# Patient Record
Sex: Female | Born: 1995 | Race: White | Hispanic: No | Marital: Single | Smoking: Never smoker
Health system: Southern US, Community
[De-identification: ages and names within clinical notes are randomized; demographics above are authoritative.]

---
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
## 2017-02-03 ENCOUNTER — Telehealth: Payer: Self-pay | Admitting: *Deleted

## 2017-02-03 NOTE — Telephone Encounter (Signed)
Pt mother Clydie Braun (has DPR access) called to receive pt recent lab results on 12/13/16, mother informed with normal CBC level.

## 2017-03-12 ENCOUNTER — Encounter: Payer: Self-pay | Admitting: Gynecology

## 2017-09-21 ENCOUNTER — Other Ambulatory Visit: Payer: Self-pay

## 2017-09-21 ENCOUNTER — Encounter (HOSPITAL_COMMUNITY): Payer: Self-pay | Admitting: Emergency Medicine

## 2017-09-21 ENCOUNTER — Telehealth (HOSPITAL_COMMUNITY): Payer: Self-pay | Admitting: Emergency Medicine

## 2017-09-21 ENCOUNTER — Ambulatory Visit (HOSPITAL_COMMUNITY)
Admission: EM | Admit: 2017-09-21 | Discharge: 2017-09-21 | Disposition: A | Discharge status: Home / Self Care | Payer: BLUE CROSS/BLUE SHIELD | Attending: Family Medicine | Admitting: Family Medicine

## 2017-09-21 DIAGNOSIS — R3989 Other symptoms and signs involving the genitourinary system: Secondary | ICD-10-CM | POA: Insufficient documentation

## 2017-09-21 DIAGNOSIS — N3090 Cystitis, unspecified without hematuria: Secondary | ICD-10-CM | POA: Diagnosis not present

## 2017-09-21 DIAGNOSIS — R300 Dysuria: Secondary | ICD-10-CM | POA: Diagnosis not present

## 2017-09-21 LAB — POCT URINALYSIS DIP (DEVICE)
BILIRUBIN URINE: NEGATIVE
Glucose, UA: NEGATIVE mg/dL
Ketones, ur: NEGATIVE mg/dL
NITRITE: NEGATIVE
Protein, ur: 30 mg/dL — AB
Specific Gravity, Urine: <=1.005 (ref 1.005–1.030)
Urobilinogen, UA: 0.2 mg/dL (ref 0.0–1.0)
pH: 6.5 (ref 5.0–8.0)

## 2017-09-21 LAB — POCT PREGNANCY, URINE: PREG TEST UR: NEGATIVE

## 2017-09-21 MED ORDER — SULFAMETHOXAZOLE-TRIMETHOPRIM 800-160 MG PO TABS: 1.0000 | ORAL_TABLET | Freq: Two times a day (BID) | ORAL | 0 refills | Status: DC

## 2017-09-21 MED ORDER — SULFAMETHOXAZOLE-TRIMETHOPRIM 800-160 MG PO TABS: 1.0000 | ORAL_TABLET | Freq: Two times a day (BID) | ORAL | 0 refills | Status: AC

## 2017-09-21 NOTE — Telephone Encounter (Signed)
Pt called to have rx sent to different pharmacy

## 2017-09-21 NOTE — Discharge Instructions (Signed)
A urine culture is being performed to make sure the bacteria causing the infection is going to completely eradicate the infection.  The results of the culture will be completed by Wednesday. Continue the antibiotics unless told to switch to something else.

## 2017-09-21 NOTE — ED Triage Notes (Signed)
Pt c/o UTi symptoms, dysuria, bladder pain.

## 2017-09-21 NOTE — ED Provider Notes (Signed)
[facility]   [id number] 09/21/17 Arrival Time: 1625   SUBJECTIVE:  Kimberly Sexton is a 21 y.o. female who presents to the urgent care with complaint of UTi symptoms, dysuria, bladder pain.      History reviewed. No pertinent past medical history. Family History  Problem Relation Age of Onset  . Breast cancer Maternal Aunt   . Cancer Maternal Grandmother        thyroid   . Hypertension Maternal Grandmother   . Cancer Other        ovarian- maternal great-great grandmother    Social History   Socioeconomic History  . Marital status: Single    Spouse name: Not on file  . Number of children: Not on file  . Years of education: Not on file  . Highest education level: Not on file  Social Needs  . Financial resource strain: Not on file  . Food insecurity - worry: Not on file  . Food insecurity - inability: Not on file  . Transportation needs - medical: Not on file  . Transportation needs - non-medical: Not on file  Occupational History  . Not on file  Tobacco Use  . Smoking status: Never Smoker  . Smokeless tobacco: Never Used  Substance and Sexual Activity  . Alcohol use: No  . Drug use: Not on file  . Sexual activity: No  Other Topics Concern  . Not on file  Social History Narrative  . Not on file   Current Meds  Medication Sig  . desogestrel-ethinyl estradiol (MIRCETTE) 0.15-0.02/0.01 MG (21/5) tablet Take 1 tablet by mouth daily.   No Known Allergies    ROS: As per HPI, remainder of ROS negative.   OBJECTIVE:   Vitals:   09/21/17 1703  BP: 121/85  Pulse: 96  Resp: 14  Temp: 98.6 F (37 C)  SpO2: 100%     General appearance: alert; no distress Eyes: PERRL; EOMI; conjunctiva normal HENT: normocephalic; atraumatic, external ears normal without trauma; nasal mucosa normal; oral mucosa normal Neck: supple Back: no CVA tenderness Extremities: no cyanosis or edema; symmetrical with no gross deformities Skin: warm and dry Neurologic:  normal gait; grossly normal Psychological: alert and cooperative; normal mood and affect      Labs:  Results for orders placed or performed during the hospital encounter of 09/21/17  Pregnancy, urine POC  Result Value Ref Range   Preg Test, Ur NEGATIVE NEGATIVE  POCT urinalysis dip (device)  Result Value Ref Range   Glucose, UA NEGATIVE NEGATIVE mg/dL   Bilirubin Urine NEGATIVE NEGATIVE   Ketones, ur NEGATIVE NEGATIVE mg/dL   Specific Gravity, Urine <=1.005 1.005 - 1.030   Hgb urine dipstick LARGE (A) NEGATIVE   pH 6.5 5.0 - 8.0   Protein, ur 30 (A) NEGATIVE mg/dL   Urobilinogen, UA 0.2 0.0 - 1.0 mg/dL   Nitrite NEGATIVE NEGATIVE   Leukocytes, UA SMALL (A) NEGATIVE    Labs Reviewed  POCT URINALYSIS DIP (DEVICE) - Abnormal; Notable for the following components:      Result Value   Hgb urine dipstick LARGE (*)    Protein, ur 30 (*)    Leukocytes, UA SMALL (*)    All other components within normal limits  URINE CULTURE  POCT PREGNANCY, URINE    No results found.     ASSESSMENT & PLAN:  1. Cystitis     Meds ordered this encounter  Medications  . sulfamethoxazole-trimethoprim (BACTRIM DS,SEPTRA DS) 800-160 MG tablet    Sig:  Take 1 tablet by mouth 2 (two) times daily for 7 days.    Dispense:  14 tablet    Refill:  0    Reviewed expectations re: course of current medical issues. Questions answered. Outlined signs and symptoms indicating need for more acute intervention. Patient verbalized understanding. After Visit Summary given.    Procedures:      Elvina SidleLauenstein, Dreden Rivere, MD 09/21/17 1719

## 2017-09-22 LAB — URINE CULTURE: Culture: NO GROWTH

## 2017-10-29 ENCOUNTER — Telehealth: Payer: Self-pay | Admitting: *Deleted

## 2017-10-29 DIAGNOSIS — Z3009 Encounter for other general counseling and advice on contraception: Secondary | ICD-10-CM

## 2017-10-29 MED ORDER — DESOGESTREL-ETHINYL ESTRADIOL 0.15-0.02/0.01 MG (21/5) PO TABS: 1.0000 | ORAL_TABLET | Freq: Every day | ORAL | 0 refills | Status: DC

## 2017-10-29 NOTE — Telephone Encounter (Signed)
Patient has annual scheduled on 12/16/17 needs refill on birth control pills. Rx sent.

## 2017-12-16 ENCOUNTER — Encounter: Payer: Self-pay | Admitting: Women's Health

## 2017-12-16 ENCOUNTER — Ambulatory Visit (INDEPENDENT_AMBULATORY_CARE_PROVIDER_SITE_OTHER): Payer: BLUE CROSS/BLUE SHIELD | Admitting: Women's Health

## 2017-12-16 VITALS — BP 102/65 | Ht 69.0 in | Wt 125.2 lb

## 2017-12-16 DIAGNOSIS — Z113 Encounter for screening for infections with a predominantly sexual mode of transmission: Secondary | ICD-10-CM | POA: Diagnosis not present

## 2017-12-16 DIAGNOSIS — Z01419 Encounter for gynecological examination (general) (routine) without abnormal findings: Secondary | ICD-10-CM | POA: Diagnosis not present

## 2017-12-16 MED ORDER — DROSPIRENONE-ETHINYL ESTRADIOL 3-0.02 MG PO TABS: 1.0000 | ORAL_TABLET | Freq: Every day | ORAL | 4 refills | Status: DC

## 2017-12-16 NOTE — Patient Instructions (Signed)

## 2017-12-16 NOTE — Progress Notes (Signed)
Kimberly DickerCaroline H Sexton 12/04/1995 [id number]    History:    Presents for annual exam.  Monthly cycle on Mircette but has had continued problems with acne. Would like to try different pill. Gardasil series completed. Sexually active first partner not his but had a negative GC/Chlamydia at student health. Reports several UTIs this past year asymptomatic today. Gardasil completed.  Past medical history, past surgical history, family history and social history were all reviewed and documented in the EPIC chart. Sophomore at [employer] doing well planning to study abroad in [place] fall  2019. Communication and design major. Parents healthy.  ROS:  A ROS was performed and pertinent positives and negatives are included.  Exam:  Vitals:   12/16/17 1550  BP: 102/65  Weight: 125 lb 3.2 oz (56.8 kg)  Height: 5\' 9"  (1.753 m)   Body mass index is 18.49 kg/m.   General appearance:  Normal Thyroid:  Symmetrical, normal in size, without palpable masses or nodularity. Respiratory  Auscultation:  Clear without wheezing or rhonchi Cardiovascular  Auscultation:  Regular rate, without rubs, murmurs or gallops  Edema/varicosities:  Not grossly evident Abdominal  Soft,nontender, without masses, guarding or rebound.  Liver/spleen:  No organomegaly noted  Hernia:  None appreciated  Skin  Inspection:  Grossly normal   Breasts: Examined lying and sitting.     Right: Without masses, retractions, discharge or axillary adenopathy.     Left: Without masses, retractions, discharge or axillary adenopathy. Gentitourinary   Inguinal/mons:  Normal without inguinal adenopathy  External genitalia:  Normal  BUS/Urethra/Skene's glands:  Normal  Vagina:  Normal  Cervix:  Normal  Uterus:  normal in size, shape and contour.  Midline and mobile  Adnexa/parametria:     Rt: Without masses or tenderness.   Lt: Without masses or tenderness.  Anus and perineum: Normal    Assessment/Plan:  22 y.o. S WF G0 for annual exam.    Monthly cycle on Mircette Acne STD screen  Plan: Options reviewed will try Yaz prescription, proper use, slight risk for blood clots and strokes reviewed. Finish out current pack prior to starting Yaz. Condoms encouraged until permanent partner. UTI prevention discussed. SBE's, exercise, calcium rich diet, MVI daily encouraged. Campus and driving safety reviewed. CBC, HIV, hep B, C, RPR.    Harrington Challengerancy J Oris [facility]NP, 4:43 PM 12/16/2017

## 2017-12-21 LAB — CBC WITH DIFFERENTIAL/PLATELET
BASOS PCT: 0.4 %
Basophils Absolute: 27 cells/uL (ref 0–200)
EOS ABS: 40 {cells}/uL (ref 15–500)
Eosinophils Relative: 0.6 %
HCT: 41.8 % (ref 35.0–45.0)
HEMOGLOBIN: 13.3 g/dL (ref 11.7–15.5)
Lymphs Abs: 2010 cells/uL (ref 850–3900)
MCH: 27.5 pg (ref 27.0–33.0)
MCHC: 31.8 g/dL — ABNORMAL LOW (ref 32.0–36.0)
MCV: 86.5 fL (ref 80.0–100.0)
MONOS PCT: 5.1 %
MPV: 9.6 fL (ref 7.5–12.5)
NEUTROS ABS: 4281 {cells}/uL (ref 1500–7800)
Neutrophils Relative %: 63.9 %
Platelets: 333 10*3/uL (ref 140–400)
RBC: 4.83 10*6/uL (ref 3.80–5.10)
RDW: 12.8 % (ref 11.0–15.0)
Total Lymphocyte: 30 %
WBC mixed population: 342 cells/uL (ref 200–950)
WBC: 6.7 10*3/uL (ref 3.8–10.8)

## 2017-12-21 LAB — HEPATITIS C ANTIBODY
HEP C AB: REACTIVE — AB
SIGNAL TO CUT-OFF: 4.31 — AB (ref <1.00)

## 2017-12-21 LAB — HCV RNA,QUANTITATIVE REAL TIME PCR
HCV Quantitative Log: <1.18 Log IU/mL
HCV RNA, PCR, QN: NOT DETECTED [IU]/mL

## 2017-12-21 LAB — HIV ANTIBODY (ROUTINE TESTING W REFLEX): HIV: NONREACTIVE

## 2017-12-21 LAB — HEPATITIS B SURFACE ANTIGEN: HEP B S AG: NONREACTIVE

## 2017-12-21 LAB — RPR: RPR Ser Ql: NONREACTIVE

## 2018-02-19 ENCOUNTER — Telehealth: Payer: Self-pay | Admitting: *Deleted

## 2018-02-19 NOTE — Telephone Encounter (Signed)
Patient called has some brown discharge for 1 day, has recurrent UTI infections, [facility] [employer] [facility] [employer] [facility], on second pack of new pack of birth control pills, I told pt not abnormal may have spotting with new pills.

## 2018-04-21 ENCOUNTER — Encounter: Payer: Self-pay | Admitting: Women's Health

## 2018-04-21 ENCOUNTER — Ambulatory Visit: Payer: BLUE CROSS/BLUE SHIELD | Admitting: Women's Health

## 2018-04-21 VITALS — BP 118/78

## 2018-04-21 DIAGNOSIS — N898 Other specified noninflammatory disorders of vagina: Secondary | ICD-10-CM

## 2018-04-21 MED ORDER — SULFAMETHOXAZOLE-TRIMETHOPRIM 800-160 MG PO TABS: 1.0000 | ORAL_TABLET | Freq: Two times a day (BID) | ORAL | 0 refills | Status: AC

## 2018-04-21 MED ORDER — DROSPIRENONE-ETHINYL ESTRADIOL 3-0.02 MG PO TABS: 1.0000 | ORAL_TABLET | Freq: Every day | ORAL | 4 refills | Status: DC

## 2018-04-21 NOTE — Progress Notes (Signed)
22 year old S WF G0 Presents with complaint of vaginal lesion which has been present since 10/2017. Denies burning, itching, pain or drainage at site. Denies vaginal discharge, vaginal irritation, burning with urination, urinary frequency, or urinary discomfort. Same partner. Yaz for contraception. History of recurrent UTIs. Planning abroad studies to [place] in fall 2019.  Exam:. Appears well.  External genitalia within normal limits with no visible lesion, bump, erythema or discharge.  Speculum exam no visible discharge, erythema or odor noted,  Normal external genitalia.  Plan: Reassurance given with normal exam findings. Prescription given for Yaz 3-0.02 mg x4 packs for travel abroad. Discussed proper use and increased risk of stroke and blood clots. Bactrim DS 800-160 mg twice daily prescribed. Instructed to carry with her to [place] but not to take it unless she feels she has a UTI. Proper use given.

## 2018-04-21 NOTE — Patient Instructions (Signed)
Interstitial Cystitis Interstitial cystitis is a condition that causes inflammation of the bladder. The bladder is a hollow organ in the lower part of your abdomen. It stores urine after the urine is made by your kidneys. With interstitial cystitis, you may have pain in the bladder area. You may also have a frequent and urgent need to urinate. The severity of interstitial cystitis can vary from person to person. You may have flare-ups of the condition, and then it may go away for a while. For many people who have this condition, it becomes a long-term problem. What are the causes? The cause of this condition is not known. What increases the risk? This condition is more likely to develop in women. What are the signs or symptoms? Symptoms of interstitial cystitis vary, and they can change over time. Symptoms may include:  Discomfort or pain in the bladder area. This can range from mild to severe. The pain may change in intensity as the bladder fills with urine or as it empties.  Pelvic pain.  An urgent need to urinate.  Frequent urination.  Pain during sexual intercourse.  Pinpoint bleeding on the bladder wall.  For women, the symptoms often get worse during menstruation. How is this diagnosed? This condition is diagnosed by evaluating your symptoms and ruling out other causes. A physical exam will be done. Various tests may be done to rule out other conditions. Common tests include:  Urine tests.  Cystoscopy. In this test, a tool that is like a very thin telescope is used to look into your bladder.  Biopsy. This involves taking a sample of tissue from the bladder wall to be examined under a microscope.  How is this treated? There is no cure for interstitial cystitis, but treatment methods are available to control your symptoms. Work closely with your health care provider to find the treatments that will be most effective for you. Treatment options may include:  Medicines to relieve  pain and to help reduce the number of times that you feel the need to urinate.  Bladder training. This involves learning ways to control when you urinate, such as: ? Urinating at scheduled times. ? Training yourself to delay urination. ? Doing exercises (Kegel exercises) to strengthen the muscles that control urine flow.  Lifestyle changes, such as changing your diet or taking steps to control stress.  Use of a device that provides electrical stimulation in order to reduce pain.  A procedure that stretches your bladder by filling it with air or fluid.  Surgery. This is rare. It is only done for extreme cases if other treatments do not help.  Follow these instructions at home:  Take medicines only as directed by your health care provider.  Use bladder training techniques as directed. ? Keep a bladder diary to find out which foods, liquids, or activities make your symptoms worse. ? Use your bladder diary to schedule bathroom trips. If you are away from home, plan to be near a bathroom at each of your scheduled times. ? Make sure you urinate just before you leave the house and just before you go to bed.  Do Kegel exercises as directed by your health care provider.  Do not drink alcohol.  Do not use any tobacco products, including cigarettes, chewing tobacco, or electronic cigarettes. If you need help quitting, ask your health care provider.  Make dietary changes as directed by your health care provider. You may need to avoid spicy foods and foods that contain a high amount   of potassium.  Limit your drinking of beverages that stimulate urination. These include soda, coffee, and tea.  Keep all follow-up visits as directed by your health care provider. This is important. Contact a health care provider if:  Your symptoms do not get better after treatment.  Your pain and discomfort are getting worse.  You have more frequent urges to urinate.  You have a fever. Get help right away  if:  You are not able to control your bladder at all. This information is not intended to replace advice given to you by your health care provider. Make sure you discuss any questions you have with your health care provider. Document Released: 06/14/2004 Document Revised: 03/21/2016 Document Reviewed: 06/21/2014 Elsevier Interactive Patient Education  2018 Elsevier Inc.  

## 2018-10-26 ENCOUNTER — Other Ambulatory Visit: Payer: Self-pay | Admitting: Women's Health

## 2018-10-26 ENCOUNTER — Telehealth: Payer: Self-pay | Admitting: *Deleted

## 2018-10-26 NOTE — Telephone Encounter (Signed)
Patient called att[employer] they have had  a mumps outbreak and she needs the 3rd booster vaccine, asked if you could prescribed it for her so [facility] will give her the vaccine? I did tell her I didn't think we could prescribe but I will check.  Please advise

## 2018-10-26 NOTE — Telephone Encounter (Signed)
Kimberly Sexton I placed medication pending can you confirm this is correct?

## 2018-10-26 NOTE — Telephone Encounter (Signed)
Patient informed. 

## 2018-10-26 NOTE — Telephone Encounter (Signed)
Okay to call in the MMR to [facility], I do not think she can just get a mump's vaccine.

## 2018-10-26 NOTE — Telephone Encounter (Signed)
Okay, sent the order to [facility] let her know to go there to get it.  Thank you

## 2018-10-27 DIAGNOSIS — Z23 Encounter for immunization: Secondary | ICD-10-CM | POA: Diagnosis not present

## 2018-10-27 MED ORDER — MEASLES, MUMPS & RUBELLA VAC IJ SOLR: 0.5000 mL | Freq: Once | INTRAMUSCULAR | 0 refills | Status: AC

## 2019-05-30 ENCOUNTER — Other Ambulatory Visit: Payer: Self-pay | Admitting: Women's Health

## 2019-05-31 NOTE — Telephone Encounter (Signed)
Patient called requesting refill on birth control pills, overdue for annual exam. I asked her to call and schedule.

## 2019-05-31 NOTE — Telephone Encounter (Signed)
Annual exam scheduled on 06/30/19

## 2019-06-29 ENCOUNTER — Other Ambulatory Visit: Payer: Self-pay

## 2019-06-30 ENCOUNTER — Encounter: Payer: Self-pay | Admitting: Women's Health

## 2019-06-30 ENCOUNTER — Ambulatory Visit: Payer: BC Managed Care – PPO | Admitting: Women's Health

## 2019-06-30 VITALS — BP 110/72 | Ht 69.0 in | Wt 131.6 lb

## 2019-06-30 DIAGNOSIS — Z01419 Encounter for gynecological examination (general) (routine) without abnormal findings: Secondary | ICD-10-CM | POA: Diagnosis not present

## 2019-06-30 LAB — CBC WITH DIFFERENTIAL/PLATELET
Absolute Monocytes: 439 cells/uL (ref 200–950)
Basophils Absolute: 31 cells/uL (ref 0–200)
Basophils Relative: 0.4 %
Eosinophils Absolute: 39 cells/uL (ref 15–500)
Eosinophils Relative: 0.5 %
HCT: 40.4 % (ref 35.0–45.0)
Hemoglobin: 13 g/dL (ref 11.7–15.5)
Lymphs Abs: 1340 cells/uL (ref 850–3900)
MCH: 27.9 pg (ref 27.0–33.0)
MCHC: 32.2 g/dL (ref 32.0–36.0)
MCV: 86.7 fL (ref 80.0–100.0)
MPV: 9.5 fL (ref 7.5–12.5)
Monocytes Relative: 5.7 %
Neutro Abs: 5852 cells/uL (ref 1500–7800)
Neutrophils Relative %: 76 %
Platelets: 255 10*3/uL (ref 140–400)
RBC: 4.66 10*6/uL (ref 3.80–5.10)
RDW: 12.8 % (ref 11.0–15.0)
Total Lymphocyte: 17.4 %
WBC: 7.7 10*3/uL (ref 3.8–10.8)

## 2019-06-30 MED ORDER — DROSPIRENONE-ETHINYL ESTRADIOL 3-0.02 MG PO TABS: 1.0000 | ORAL_TABLET | Freq: Every day | ORAL | 4 refills | Status: DC

## 2019-06-30 NOTE — Addendum Note (Signed)
Addended by: Lorine Bears on: 06/30/2019 12:25 PM   Modules accepted: Orders

## 2019-06-30 NOTE — Patient Instructions (Addendum)
Good luck with your senior year!!  Health Maintenance, Female Adopting a healthy lifestyle and getting preventive care are important in promoting health and wellness. Ask your health care provider about:  The right schedule for you to have regular tests and exams.  Things you can do on your own to prevent diseases and keep yourself healthy. What should I know about diet, weight, and exercise? Eat a healthy diet   Eat a diet that includes plenty of vegetables, fruits, low-fat dairy products, and lean protein.  Do not eat a lot of foods that are high in solid fats, added sugars, or sodium. Maintain a healthy weight Body mass index (BMI) is used to identify weight problems. It estimates body fat based on height and weight. Your health care provider can help determine your BMI and help you achieve or maintain a healthy weight. Get regular exercise Get regular exercise. This is one of the most important things you can do for your health. Most adults should:  Exercise for at least 150 minutes each week. The exercise should increase your heart rate and make you sweat (moderate-intensity exercise).  Do strengthening exercises at least twice a week. This is in addition to the moderate-intensity exercise.  Spend less time sitting. Even light physical activity can be beneficial. Watch cholesterol and blood lipids Have your blood tested for lipids and cholesterol at 21 years of age, then have this test every 5 years. Have your cholesterol levels checked more often if:  Your lipid or cholesterol levels are high.  You are older than 21 years of age.  You are at high risk for heart disease. What should I know about cancer screening? Depending on your health history and family history, you may need to have cancer screening at various ages. This may include screening for:  Breast cancer.  Cervical cancer.  Colorectal cancer.  Skin cancer.  Lung cancer. What should I know about heart  disease, diabetes, and high blood pressure? Blood pressure and heart disease  High blood pressure causes heart disease and increases the risk of stroke. This is more likely to develop in people who have high blood pressure readings, are of African descent, or are overweight.  Have your blood pressure checked: ? Every 3-5 years if you are 24-82 years of age. ? Every year if you are 6 years old or older. Diabetes Have regular diabetes screenings. This checks your fasting blood sugar level. Have the screening done:  Once every three years after age 22 if you are at a normal weight and have a low risk for diabetes.  More often and at a younger age if you are overweight or have a high risk for diabetes. What should I know about preventing infection? Hepatitis B If you have a higher risk for hepatitis B, you should be screened for this virus. Talk with your health care provider to find out if you are at risk for hepatitis B infection. Hepatitis C Testing is recommended for:  Everyone born from [birth year] through 1963.  Anyone with known risk factors for hepatitis C. Sexually transmitted infections (STIs)  Get screened for STIs, including gonorrhea and chlamydia, if: ? You are sexually active and are younger than 21 years of age. ? You are older than 21 years of age and your health care provider tells you that you are at risk for this type of infection. ? Your sexual activity has changed since you were last screened, and you are at increased risk for chlamydia or  gonorrhea. Ask your health care provider if you are at risk.  Ask your health care provider about whether you are at high risk for HIV. Your health care provider may recommend a prescription medicine to help prevent HIV infection. If you choose to take medicine to prevent HIV, you should first get tested for HIV. You should then be tested every 3 months for as long as you are taking the medicine. Pregnancy  If you are about to stop  having your period (premenopausal) and you may become pregnant, seek counseling before you get pregnant.  Take 400 to 800 micrograms (mcg) of folic acid every day if you become pregnant.  Ask for birth control (contraception) if you want to prevent pregnancy. Osteoporosis and menopause Osteoporosis is a disease in which the bones lose minerals and strength with aging. This can result in bone fractures. If you are 80 years old or older, or if you are at risk for osteoporosis and fractures, ask your health care provider if you should:  Be screened for bone loss.  Take a calcium or vitamin D supplement to lower your risk of fractures.  Be given hormone replacement therapy (HRT) to treat symptoms of menopause. Follow these instructions at home: Lifestyle  Do not use any products that contain nicotine or tobacco, such as cigarettes, e-cigarettes, and chewing tobacco. If you need help quitting, ask your health care provider.  Do not use street drugs.  Do not share needles.  Ask your health care provider for help if you need support or information about quitting drugs. Alcohol use  Do not drink alcohol if: ? Your health care provider tells you not to drink. ? You are pregnant, may be pregnant, or are planning to become pregnant.  If you drink alcohol: ? Limit how much you use to 0-1 drink a day. ? Limit intake if you are breastfeeding.  Be aware of how much alcohol is in your drink. In the [place] one drink equals one 12 oz bottle of beer (355 mL), one 5 oz glass of wine (148 mL), or one 1 oz glass of hard liquor (44 mL). General instructions  Schedule regular health, dental, and eye exams.  Stay current with your vaccines.  Tell your health care provider if: ? You often feel depressed. ? You have ever been abused or do not feel safe at home. Summary  Adopting a healthy lifestyle and getting preventive care are important in promoting health and wellness.  Follow your health  care provider's instructions about healthy diet, exercising, and getting tested or screened for diseases.  Follow your health care provider's instructions on monitoring your cholesterol and blood pressure. This information is not intended to replace advice given to you by your health care provider. Make sure you discuss any questions you have with your health care provider. Document Released: 04/29/2011 Document Revised: 10/07/2018 Document Reviewed: 10/07/2018 Elsevier Patient Education  2020 [employer].

## 2019-06-30 NOTE — Progress Notes (Signed)
Kimberly Sexton [birth year] [id number]    History:    Presents for annual exam.  Monthly cycle on Yaz with good relief of dysmenorrhea .  Gardasil series completed.  Not sexually active, denies need for STD screen.  Past medical history, past surgical history, family history and social history were all reviewed and documented in the EPIC chart.  Senior at [employer], Airline pilot major.  Parents healthy.  ROS:  A ROS was performed and pertinent positives and negatives are included.  Exam:  Vitals:   06/30/19 1111  BP: 110/72  Weight: 131 lb 9.6 oz (59.7 kg)  Height: 5\' 9"  (1.753 m)   Body mass index is 19.43 kg/m.   General appearance:  Normal Thyroid:  Symmetrical, normal in size, without palpable masses or nodularity. Respiratory  Auscultation:  Clear without wheezing or rhonchi Cardiovascular  Auscultation:  Regular rate, without rubs, murmurs or gallops  Edema/varicosities:  Not grossly evident Abdominal  Soft,nontender, without masses, guarding or rebound.  Liver/spleen:  No organomegaly noted  Hernia:  None appreciated  Skin  Inspection:  Grossly normal   Breasts: Examined lying and sitting.     Right: Without masses, retractions, discharge or axillary adenopathy.     Left: Without masses, retractions, discharge or axillary adenopathy. Gentitourinary   Inguinal/mons:  Normal without inguinal adenopathy  External genitalia:  Normal  BUS/Urethra/Skene's glands:  Normal  Vagina:  Normal  Cervix:  Normal  Uterus:  normal in size, shape and contour.  Midline and mobile  Adnexa/parametria:     Rt: Without masses or tenderness.   Lt: Without masses or tenderness.  Anus and perineum: Normal   Assessment/Plan:  23 y.o. S WF G0 for annual exam with no complaints.  Monthly cycle on Yaz  Plan: Yaz prescription, proper use, slight risk for blood clots and strokes reviewed.  Condoms encouraged until permanent partner.  SBEs, exercise, calcium rich foods, MVI  daily encouraged.  CBC, Pap, new screening guidelines reviewed.[name] [facility], 11:19 AM 06/30/2019

## 2019-07-01 LAB — PAP IG W/ RFLX HPV ASCU

## 2019-07-07 DIAGNOSIS — Z20828 Contact with and (suspected) exposure to other viral communicable diseases: Secondary | ICD-10-CM | POA: Diagnosis not present

## 2019-09-20 DIAGNOSIS — Z20828 Contact with and (suspected) exposure to other viral communicable diseases: Secondary | ICD-10-CM | POA: Diagnosis not present

## 2020-07-13 ENCOUNTER — Telehealth: Payer: Self-pay | Admitting: *Deleted

## 2020-07-13 MED ORDER — DROSPIRENONE-ETHINYL ESTRADIOL 3-0.02 MG PO TABS: 1.0000 | ORAL_TABLET | Freq: Every day | ORAL | 0 refills | Status: AC

## 2020-07-13 NOTE — Telephone Encounter (Signed)
Yes please send. Thank you.  °

## 2020-07-13 NOTE — Telephone Encounter (Signed)
Patient called has moved to [place], has not found a Gyn there yet, asked if her birth control pills could be sent for 3 month supply so she can have time to find a doctor? Okay to send?

## 2020-07-13 NOTE — Telephone Encounter (Signed)
Patient aware, Rx sent.  

## 2020-08-22 DIAGNOSIS — Z124 Encounter for screening for malignant neoplasm of cervix: Secondary | ICD-10-CM | POA: Diagnosis not present

## 2020-08-22 DIAGNOSIS — Z3202 Encounter for pregnancy test, result negative: Secondary | ICD-10-CM | POA: Diagnosis not present

## 2020-08-22 DIAGNOSIS — Z01419 Encounter for gynecological examination (general) (routine) without abnormal findings: Secondary | ICD-10-CM | POA: Diagnosis not present

## 2020-08-22 DIAGNOSIS — D509 Iron deficiency anemia, unspecified: Secondary | ICD-10-CM | POA: Diagnosis not present

## 2020-08-22 DIAGNOSIS — Z681 Body mass index (BMI) 19 or less, adult: Secondary | ICD-10-CM | POA: Diagnosis not present

## 2020-08-22 DIAGNOSIS — Z309 Encounter for contraceptive management, unspecified: Secondary | ICD-10-CM | POA: Diagnosis not present

## 2020-08-22 DIAGNOSIS — Z139 Encounter for screening, unspecified: Secondary | ICD-10-CM | POA: Diagnosis not present

## 2020-08-22 DIAGNOSIS — Z113 Encounter for screening for infections with a predominantly sexual mode of transmission: Secondary | ICD-10-CM | POA: Diagnosis not present

## 2021-04-12 DIAGNOSIS — D225 Melanocytic nevi of trunk: Secondary | ICD-10-CM | POA: Diagnosis not present

## 2021-04-12 DIAGNOSIS — D2271 Melanocytic nevi of right lower limb, including hip: Secondary | ICD-10-CM | POA: Diagnosis not present

## 2021-04-12 DIAGNOSIS — L821 Other seborrheic keratosis: Secondary | ICD-10-CM | POA: Diagnosis not present

## 2021-04-12 DIAGNOSIS — D224 Melanocytic nevi of scalp and neck: Secondary | ICD-10-CM | POA: Diagnosis not present

## 2021-04-24 DIAGNOSIS — K219 Gastro-esophageal reflux disease without esophagitis: Secondary | ICD-10-CM | POA: Diagnosis not present

## 2021-04-24 DIAGNOSIS — J309 Allergic rhinitis, unspecified: Secondary | ICD-10-CM | POA: Diagnosis not present

## 2021-04-24 DIAGNOSIS — R053 Chronic cough: Secondary | ICD-10-CM | POA: Diagnosis not present

## 2021-06-05 DIAGNOSIS — R0982 Postnasal drip: Secondary | ICD-10-CM | POA: Diagnosis not present

## 2021-06-05 DIAGNOSIS — R051 Acute cough: Secondary | ICD-10-CM | POA: Diagnosis not present

## 2021-06-05 DIAGNOSIS — J310 Chronic rhinitis: Secondary | ICD-10-CM | POA: Diagnosis not present

## 2021-06-05 DIAGNOSIS — R0981 Nasal congestion: Secondary | ICD-10-CM | POA: Diagnosis not present

## 2021-06-16 DIAGNOSIS — J301 Allergic rhinitis due to pollen: Secondary | ICD-10-CM | POA: Diagnosis not present

## 2021-06-20 DIAGNOSIS — R051 Acute cough: Secondary | ICD-10-CM | POA: Diagnosis not present

## 2021-06-20 DIAGNOSIS — J3081 Allergic rhinitis due to animal (cat) (dog) hair and dander: Secondary | ICD-10-CM | POA: Diagnosis not present

## 2021-06-20 DIAGNOSIS — J301 Allergic rhinitis due to pollen: Secondary | ICD-10-CM | POA: Diagnosis not present

## 2021-06-20 DIAGNOSIS — J302 Other seasonal allergic rhinitis: Secondary | ICD-10-CM | POA: Diagnosis not present

## 2021-08-27 DIAGNOSIS — Z01419 Encounter for gynecological examination (general) (routine) without abnormal findings: Secondary | ICD-10-CM | POA: Diagnosis not present

## 2021-08-27 DIAGNOSIS — Z3202 Encounter for pregnancy test, result negative: Secondary | ICD-10-CM | POA: Diagnosis not present

## 2021-08-27 DIAGNOSIS — Z113 Encounter for screening for infections with a predominantly sexual mode of transmission: Secondary | ICD-10-CM | POA: Diagnosis not present

## 2021-08-27 DIAGNOSIS — Z13220 Encounter for screening for lipoid disorders: Secondary | ICD-10-CM | POA: Diagnosis not present

## 2021-08-27 DIAGNOSIS — Z13228 Encounter for screening for other metabolic disorders: Secondary | ICD-10-CM | POA: Diagnosis not present

## 2021-08-27 DIAGNOSIS — Z1329 Encounter for screening for other suspected endocrine disorder: Secondary | ICD-10-CM | POA: Diagnosis not present

## 2022-09-27 ENCOUNTER — Ambulatory Visit
Admit: 2022-09-27 | Discharge: 2022-09-27 | Payer: BLUE CROSS/BLUE SHIELD | Attending: Internal Medicine | Primary: Internal Medicine

## 2022-09-27 DIAGNOSIS — Z23 Encounter for immunization: Secondary | ICD-10-CM

## 2022-09-27 NOTE — Progress Notes (Signed)
Pt was given Fluarix Quadrivalent 0.5 mg.  Injection was given IM in the left deltoid. Patient tolerated procedure well.

## 2022-09-27 NOTE — Progress Notes (Signed)
CHIEF COMPLAINT:  Chief Complaint   Patient presents with    New Patient        HISTORY OF PRESENT ILLNESS:  Ms. Emily Macias is a 26 y.o. female  who presents today to establish care.    She sees GYN annually - cholesterol has been up two years and a row.  Mother and grandmother both have high cholesterol.      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  She enjoys her job.    Occasional GERD, but has allergies and this is what her cough was attributed to.  She saw allergist for testing and was put on zyrtec and flonase.    Has occasional constipation - will occasionally have associated periumbilical or lower abdominal pain.  No fever, n/v.  Maybe monthly but possibly not this frequent.  Feels it is related to stress.  No consistent changes in stool consistency.  No weight loss, night sweats, hematochezia, melena.  Typically [place] Stool 3, but will turn to Stool 1 at times.  Endorses adequate water and fiber intake.  She does take probiotics.      She got flu shot today.  Unsure of last tetanus shot.      Denies CP, SOB, edema, changes in bladder habits, skin changes.  No other questions or concerns.    PHQ:      09/27/2022     1:09 PM   PHQ-9    Little interest or pleasure in doing things 0   Feeling down, depressed, or hopeless 0   PHQ-2 Score 0   PHQ-9 Total Score 0       CURRENT MEDICATION LIST:    Current Outpatient Medications   Medication Sig Dispense Refill    cetirizine (ZYRTEC ALLERGY) 10 MG tablet Take 1 tablet by mouth daily      Drospirenone 4 MG TABS Take by mouth      fluticasone (FLONASE) 50 MCG/ACT nasal spray 1 spray by Each Nostril route daily       No current facility-administered medications for this visit.        ALLERGIES:    No Known Allergies     HISTORY:  Past Medical History:   Diagnosis Date    Hypercholesterolemia       Past Surgical History:   Procedure Laterality Date    WISDOM TOOTH EXTRACTION        Social History     Socioeconomic History    Marital status: Single     Spouse name: Not on file     Number of children: Not on file    Years of education: Not on file    Highest education level: Not on file   Occupational History    Not on file   Tobacco Use    Smoking status: Never    Smokeless tobacco: Never   Vaping Use    Vaping Use: Never used   Substance and Sexual Activity    Alcohol use: Yes     Alcohol/week: 1.0 standard drink of alcohol     Types: 1 Glasses of wine per week    Drug use: Never    Sexual activity: Yes   Other Topics Concern    Not on file   Social History Narrative    Not on file     Social Determinants of Health     Financial Resource Strain: Not on file   Food Insecurity: Not on file   Transportation Needs: Not on file   Physical  Activity: Not on file   Stress: Not on file   Social Connections: Not on file   Intimate Partner Violence: Not on file   Housing Stability: Not on file      Family History   Problem Relation Age of Onset    Pancreatic Cancer Maternal Aunt     Cancer Maternal Grandfather         BLADDER    Heart Surgery Maternal Grandfather     Breast Cancer Paternal Great Grandmother         REVIEW OF SYSTEMS:  Pertinent items are noted in HPI.    PHYSICAL EXAM:  Vital Signs -   Visit Vitals  BP 99/69   Pulse 87   Ht 1.753 m (5\' 9" )   Wt 57.6 kg (127 lb)   SpO2 98%   BMI 18.75 kg/m    Body mass index is 18.75 kg/m.   General Appearance:  awake, alert, oriented, in no acute distress and well developed, well nourished  Head/face: Normocephalic and atraumatic  Eyes:  PERRL, EOMI, and Sclera nonicteric  Ears:  canals and TMs NI  Nose/Sinuses:  Nares normal. Septum midline. Mucosa normal. No drainage or sinus tenderness.  Mouth/Throat:  Mucosa moist.  No lesions.  Pharynx without erythema, edema or exudate.  Lungs:  Normal expansion.  Clear to auscultation.  No rales, rhonchi, or wheezing.  Heart:  Heart sounds are normal.  Regular rate and rhythm without murmur, gallop or rub.  Abdomen:  Soft, non-tender, normal bowel sounds.  No bruits, organomegaly or masses.  Extremities:  Extremities warm to touch, pink, with no edema. and pulses present in all extremities  Musculoskeletal:  Range of motion normal in hips, knees, shoulders, and spine.    I completed a physical exam on this patient without abnormalities    LABS  No results found for this visit on 09/27/22.  No results found for any previous visit.       IMPRESSION/PLAN    1. Need for vaccination  Influenza vaccine given  - Influenza, FLUARIX, (age 59 mo+),  IM, PF, 0.5 mL    2. Physical exam  Normal physical exam         Follow up and Dispositions:    Return in about 1 year (around 09/28/2023).       Jeani Hawking, MD

## 2023-09-30 NOTE — Telephone Encounter (Signed)
-  pt needs to reschedule canceled appointment   -she will like to reschedule for the spring   -please advise    Sent to B.Zenaida Deed

## 2023-09-30 NOTE — Telephone Encounter (Signed)
 Called and left a message for the patient to call back to get rescheduled

## 2023-10-01 NOTE — Telephone Encounter (Signed)
 Called and left another message for the patient to call back to get re-scheduled

## 2023-10-03 ENCOUNTER — Encounter: Attending: Internal Medicine | Primary: Internal Medicine
